# Patient Record
Sex: Male | Born: 1965 | Race: Black or African American | Hispanic: No | Marital: Single | Smoking: Never smoker
Health system: Southern US, Community
[De-identification: ages and names within clinical notes are randomized; demographics above are authoritative.]

## PROBLEM LIST (undated history)

## (undated) DIAGNOSIS — I10 Essential (primary) hypertension: Secondary | ICD-10-CM

---

## 2007-09-29 ENCOUNTER — Ambulatory Visit: Payer: Self-pay | Admitting: Internal Medicine

## 2007-09-29 ENCOUNTER — Encounter (INDEPENDENT_AMBULATORY_CARE_PROVIDER_SITE_OTHER): Payer: Self-pay | Admitting: Family Medicine

## 2007-09-29 LAB — CONVERTED CEMR LAB
AST: 31 units/L (ref 0–37)
Albumin: 4.3 g/dL (ref 3.5–5.2)
Alkaline Phosphatase: 65 units/L (ref 39–117)
BUN: 12 mg/dL (ref 6–23)
Calcium: 9.1 mg/dL (ref 8.4–10.5)
Chloride: 103 meq/L (ref 96–112)
Eosinophils Absolute: 0.3 10*3/uL (ref 0.0–0.7)
Glucose, Bld: 98 mg/dL (ref 70–99)
LDL Cholesterol: 113 mg/dL — ABNORMAL HIGH (ref 0–99)
Lymphs Abs: 2.2 10*3/uL (ref 0.7–4.0)
MCV: 89 fL (ref 78.0–100.0)
Monocytes Relative: 9 % (ref 3–12)
Neutro Abs: 4 10*3/uL (ref 1.7–7.7)
Neutrophils Relative %: 56 % (ref 43–77)
Platelets: 259 10*3/uL (ref 150–400)
Potassium: 4.2 meq/L (ref 3.5–5.3)
RBC: 5.08 M/uL (ref 4.22–5.81)
Sodium: 139 meq/L (ref 135–145)
TSH: 1.58 microintl units/mL (ref 0.350–5.50)
Total Protein: 7.5 g/dL (ref 6.0–8.3)
WBC: 7.3 10*3/uL (ref 4.0–10.5)

## 2008-01-23 ENCOUNTER — Ambulatory Visit: Payer: Self-pay | Admitting: Family Medicine

## 2011-02-04 ENCOUNTER — Emergency Department (HOSPITAL_COMMUNITY)
Admission: EM | Admit: 2011-02-04 | Discharge: 2011-02-04 | Disposition: A | Discharge status: Home / Self Care | Payer: Self-pay | Attending: Emergency Medicine | Admitting: Emergency Medicine

## 2011-02-04 DIAGNOSIS — T2610XA Burn of cornea and conjunctival sac, unspecified eye, initial encounter: Secondary | ICD-10-CM | POA: Insufficient documentation

## 2011-02-04 DIAGNOSIS — H109 Unspecified conjunctivitis: Secondary | ICD-10-CM | POA: Insufficient documentation

## 2011-02-04 DIAGNOSIS — IMO0002 Reserved for concepts with insufficient information to code with codable children: Secondary | ICD-10-CM | POA: Insufficient documentation

## 2011-07-26 ENCOUNTER — Emergency Department (HOSPITAL_COMMUNITY): Payer: No Typology Code available for payment source

## 2011-07-26 ENCOUNTER — Emergency Department (HOSPITAL_COMMUNITY)
Admission: EM | Admit: 2011-07-26 | Discharge: 2011-07-26 | Disposition: A | Discharge status: Home / Self Care | Payer: No Typology Code available for payment source | Attending: Emergency Medicine | Admitting: Emergency Medicine

## 2011-07-26 ENCOUNTER — Encounter (HOSPITAL_COMMUNITY): Payer: Self-pay

## 2011-07-26 DIAGNOSIS — S161XXA Strain of muscle, fascia and tendon at neck level, initial encounter: Secondary | ICD-10-CM

## 2011-07-26 DIAGNOSIS — M542 Cervicalgia: Secondary | ICD-10-CM | POA: Insufficient documentation

## 2011-07-26 DIAGNOSIS — I10 Essential (primary) hypertension: Secondary | ICD-10-CM

## 2011-07-26 DIAGNOSIS — S139XXA Sprain of joints and ligaments of unspecified parts of neck, initial encounter: Secondary | ICD-10-CM | POA: Insufficient documentation

## 2011-07-26 DIAGNOSIS — Y92410 Unspecified street and highway as the place of occurrence of the external cause: Secondary | ICD-10-CM | POA: Insufficient documentation

## 2011-07-26 DIAGNOSIS — R0789 Other chest pain: Secondary | ICD-10-CM

## 2011-07-26 DIAGNOSIS — R071 Chest pain on breathing: Secondary | ICD-10-CM | POA: Insufficient documentation

## 2011-07-26 HISTORY — DX: Essential (primary) hypertension: I10

## 2011-07-26 MED ORDER — ACETAMINOPHEN-CODEINE #3 300-30 MG PO TABS: 1.0000 | ORAL_TABLET | Freq: Four times a day (QID) | ORAL | Status: AC | PRN

## 2011-07-26 MED ORDER — HYDROCHLOROTHIAZIDE 25 MG PO TABS: 25.0000 mg | ORAL_TABLET | Freq: Every day | ORAL | Status: DC

## 2011-07-26 MED ORDER — IBUPROFEN 800 MG PO TABS
800.0000 mg | ORAL_TABLET | Freq: Once | ORAL | Status: AC
  Administered 2011-07-26: 800 mg via ORAL
  Filled 2011-07-26: qty 1

## 2011-07-26 MED ORDER — ONDANSETRON 4 MG PO TBDP
4.0000 mg | ORAL_TABLET | Freq: Once | ORAL | Status: AC
  Administered 2011-07-26: 4 mg via ORAL
  Filled 2011-07-26: qty 1

## 2011-07-26 MED ORDER — HYDROMORPHONE HCL PF 1 MG/ML IJ SOLN
1.0000 mg | Freq: Once | INTRAMUSCULAR | Status: AC
  Administered 2011-07-26: 1 mg via INTRAMUSCULAR
  Filled 2011-07-26: qty 1

## 2011-07-26 MED ORDER — NAPROXEN 375 MG PO TABS: 375.0000 mg | ORAL_TABLET | Freq: Two times a day (BID) | ORAL | Status: AC
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
## 2011-07-26 NOTE — ED Notes (Signed)
Pt was cleared from the C- collar

## 2011-07-26 NOTE — ED Notes (Signed)
Pt ambulated to the nurses station with no assistance. Pt complained of pain to the right ribs.
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
## 2011-07-26 NOTE — ED Notes (Signed)
Pt was cleared from the back board by B. Theatre manager

## 2011-07-27 NOTE — ED Provider Notes (Signed)
Medical screening examination/treatment/procedure(s) were performed by non-physician practitioner and as supervising physician I was immediately available for consultation/collaboration.   Loren Racer, MD 07/27/11 [phone]

## 2016-06-03 ENCOUNTER — Observation Stay (HOSPITAL_COMMUNITY)
Admission: EM | Admit: 2016-06-03 | Discharge: 2016-06-05 | Disposition: A | Discharge status: Home / Self Care | Payer: 59 | Attending: Internal Medicine | Admitting: Internal Medicine

## 2016-06-03 ENCOUNTER — Encounter (HOSPITAL_COMMUNITY): Payer: Self-pay | Admitting: Emergency Medicine

## 2016-06-03 ENCOUNTER — Emergency Department (HOSPITAL_COMMUNITY): Payer: 59

## 2016-06-03 DIAGNOSIS — I214 Non-ST elevation (NSTEMI) myocardial infarction: Secondary | ICD-10-CM

## 2016-06-03 DIAGNOSIS — I10 Essential (primary) hypertension: Secondary | ICD-10-CM

## 2016-06-03 DIAGNOSIS — J45909 Unspecified asthma, uncomplicated: Secondary | ICD-10-CM | POA: Insufficient documentation

## 2016-06-03 DIAGNOSIS — I471 Supraventricular tachycardia, unspecified: Secondary | ICD-10-CM | POA: Diagnosis present

## 2016-06-03 DIAGNOSIS — I252 Old myocardial infarction: Secondary | ICD-10-CM | POA: Insufficient documentation

## 2016-06-03 DIAGNOSIS — Z7982 Long term (current) use of aspirin: Secondary | ICD-10-CM | POA: Diagnosis not present

## 2016-06-03 DIAGNOSIS — R7989 Other specified abnormal findings of blood chemistry: Secondary | ICD-10-CM

## 2016-06-03 DIAGNOSIS — R778 Other specified abnormalities of plasma proteins: Secondary | ICD-10-CM

## 2016-06-03 DIAGNOSIS — R748 Abnormal levels of other serum enzymes: Secondary | ICD-10-CM | POA: Diagnosis present

## 2016-06-03 DIAGNOSIS — R0602 Shortness of breath: Secondary | ICD-10-CM | POA: Diagnosis not present

## 2016-06-03 LAB — CBC
HCT: 44.7 % (ref 39.0–52.0)
HEMOGLOBIN: 15.3 g/dL (ref 13.0–17.0)
MCH: 28.8 pg (ref 26.0–34.0)
MCHC: 34.2 g/dL (ref 30.0–36.0)
MCV: 84.2 fL (ref 78.0–100.0)
Platelets: 228 10*3/uL (ref 150–400)
RBC: 5.31 MIL/uL (ref 4.22–5.81)
RDW: 13.7 % (ref 11.5–15.5)
WBC: 9 10*3/uL (ref 4.0–10.5)

## 2016-06-03 LAB — BASIC METABOLIC PANEL
ANION GAP: 9 (ref 5–15)
BUN: 15 mg/dL (ref 6–20)
CALCIUM: 8.9 mg/dL (ref 8.9–10.3)
CO2: 24 mmol/L (ref 22–32)
Chloride: 104 mmol/L (ref 101–111)
Creatinine, Ser: 1.21 mg/dL (ref 0.61–1.24)
Glucose, Bld: 121 mg/dL — ABNORMAL HIGH (ref 65–99)
Potassium: 3.8 mmol/L (ref 3.5–5.1)
Sodium: 137 mmol/L (ref 135–145)

## 2016-06-03 LAB — RAPID URINE DRUG SCREEN, HOSP PERFORMED
Amphetamines: NOT DETECTED
Barbiturates: NOT DETECTED
Benzodiazepines: NOT DETECTED
COCAINE: NOT DETECTED
OPIATES: NOT DETECTED
Tetrahydrocannabinol: NOT DETECTED

## 2016-06-03 LAB — I-STAT TROPONIN, ED
TROPONIN I, POC: 0.02 ng/mL (ref 0.00–0.08)
Troponin i, poc: 0.84 ng/mL (ref 0.00–0.08)

## 2016-06-03 LAB — TSH: TSH: 1.671 u[IU]/mL (ref 0.350–4.500)

## 2016-06-03 MED ORDER — ADENOSINE 6 MG/2ML IV SOLN: 6.0000 mg | Freq: Once | INTRAVENOUS | Status: DC

## 2016-06-03 MED ORDER — ASPIRIN 325 MG PO TABS
325.0000 mg | ORAL_TABLET | ORAL | Status: AC
  Administered 2016-06-03: 325 mg via ORAL
  Filled 2016-06-03: qty 1

## 2016-06-03 NOTE — ED Notes (Signed)
Pt converted to NSR after modified vagle attempt.  EKG done.

## 2016-06-03 NOTE — H&P (Addendum)
Patient ID: Dakota Holmes MRN: [id number], DOB/AGE: 53/12/1965  Admit date: 06/03/2016 Primary Physician: No PCP Per Patient Primary Cardiologist: None  CARDIOLOGY ADMISSION History & Physical   PATIENT PROFILE   51 year old man with hypertension, otherwise healthy at baseline   [name]   The patient presented to the ED today with a few hours of palpitations and SOB. He was watching TV when he suddenly felt his heart start to race. He denies lightheadedness or syncope. No chest pain. He tried to "walk it off" but felt very short of breath when attempting to walk. He denies chest pain or angina. He came to ED where ECG showed SVT with rate ~200 bpm. He converted to NSR, but did have inferolateral ST depressions that persisted. First troponin was negative, but second value was 0.8 (normal < 0.08). He has been asymptomatic since cardioversion.  He denies fevers, chills, recent travel, sick contacts. He did have one 40 oz beer yesterday, but none today. No illicit drug use. No smoking. No new medications. Family states he has mild snoring. He feels well rested when he wakes up in the morning.  Otherwise labs are unremarkable. TSH within normal limits. Tox screen negative.  Review of Systems General:  No chills, fever, night sweats or weight changes.  Cardiovascular:  No chest pain,  edema, orthopnea, palpitations, paroxysmal nocturnal dyspnea. Dermatological: No rash, lesions/masses Respiratory: No cough Urologic: No hematuria, dysuria Abdominal:   No nausea, vomiting, diarrhea, bright red blood per rectum, melena, or hematemesis Neurologic:  No visual changes, wkns, changes in mental status. All other systems reviewed and are otherwise negative except as noted above.   MEDICAL, FAMILY, AND SOCIAL HISTORY   Past Medical History:  Diagnosis Date  . Hypertension     History reviewed. No pertinent surgical history.  No Known Allergies Prior to Admission  medications   Medication Sig Start Date End Date Taking? Authorizing Provider  acetaminophen (TYLENOL) 325 MG tablet Take 650 mg by mouth every 6 (six) hours as needed for mild pain.   Yes Historical Provider, MD  Aspirin-Acetaminophen-Caffeine (GOODY HEADACHE PO) Take 1 packet by mouth as needed. For headaches.   Yes Historical Provider, MD  hydrochlorothiazide (HYDRODIURIL) 25 MG tablet Take 1 tablet (25 mg total) by mouth daily. 07/26/11 07/25/12  Anne Ng, PA-C   No family history on file. Social History   Social History  . Marital status: Single    Spouse name: N/A  . Number of children: N/A  . Years of education: N/A   Occupational History  . Not on file.   Social History Main Topics  . Smoking status: Never Smoker  . Smokeless tobacco: Never Used  . Alcohol use Yes  . Drug use: No  . Sexual activity: Not on file   Other Topics Concern  . Not on file   Social History Narrative  . No narrative on file     He works at a tire shop No regimented exercise No tobacco Drinks a couple beers per night No illicit substances   PHYSICAL EXAM  Blood pressure (!) 142/104, pulse 81, temperature 98.5 F (36.9 C), temperature source Oral, resp. rate 18, height 5\' 9"  (1.753 m), weight 95.3 kg (210 lb), SpO2 98 %.  General: Pleasant, NAD Psych: Normal affect. Neuro: Alert and oriented X 3. Moves all extremities spontaneously. HEENT: Sclera anicteric, noninjected. MMM.  Neck: no bruits. JVP is not elevated. Lungs:  Clear, normal WOB Heart: rrr no m/r/g  Abdomen: Soft, non-tender, non-distended, BS + x 4.  Extremities: No clubbing or cyanosis. Edema: none. DP/PT/Radials 2+ and equal bilaterally.   LABS and STUDIES  Troponin (Point of Care Test)  Recent Labs  06/03/16 2051  TROPIPOC 0.84*   No results for input(s): CKTOTAL, CKMB, TROPONINI in the last 72 hours. Lab Results  Component Value Date   WBC 9.0 06/03/2016   HGB 15.3 06/03/2016   HCT 44.7 06/03/2016   MCV 84.2  06/03/2016   PLT 228 06/03/2016    Recent Labs Lab 06/03/16 1606  NA 137  K 3.8  CL 104  CO2 24  BUN 15  CREATININE 1.21  CALCIUM 8.9  GLUCOSE 121*   Lab Results  Component Value Date   CHOL 207 (H) 09/29/2007   HDL 49 09/29/2007   LDLCALC 113 (H) 09/29/2007   TRIG 224 (H) 09/29/2007   No results found for: DDIMER   Radiology/Studies. Independently Reviewed CXR clear  ECG was independently reviewed. Admission ecg showed narrow complex taachycardia with HR 197 bpm. 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 He should also consider sleep study as an outpatient. Patient denies angina. - Metoprolol 25 mg BID - Transthoracic echocardiogram in AM  Elevated Troponin 0.8 - Likely demand ischemia in the setting of tachycardia. However, ECG does show signs concerning for ischemia. Risk factors include age, sex, and hypertension - Aspirin 325 mg x 1, then 81 mg daily - heparin ACS nomogram overnight - Metoprolol as above - Trend troponin - Consider ischemic evaluation inpatient/outpatient pending troponin trend - Lipid panel and A1C pending  Hypertension: - HCTZ 25 mg daily - Starting metoprolol as above  IVF: None Diet: heart healthy, NPO at midnight DVT PPX: heparin as above Dispo: Admit to observation Code Status: full code  Dakota Holmes D, MD 06/03/2016, 10:24 PM

## 2016-06-03 NOTE — ED Provider Notes (Signed)
[facility] DEPT Provider Note   CSN: [id number] Arrival date & time: 06/03/16  1546     History   Chief Complaint Chief Complaint  Patient presents with  . Headache  . Tachycardia  . Shortness of Breath    HPI Dakota Holmes is a 51 y.o. male.  HPI Pt c/o while sitting on couch watching basketball game, pt began to experience heart racing, shortness of breath and headache.  Patient states the symptoms started around 1:30. He has never had symptoms like this before. He does have a history of hypertension the past but has stopped taking his medication due to loss of primary care provider. He did describe some mild shortness of breath.   No recent immobilization, no history of clots, no leg swelling or pain, no recent surgery. Has never had a clot in his lungs or his heart.  Denies any infectious symptoms. Has not used any cocaine.  Symptoms are severe, he fell he was going to pass out. Not worse with exertion. Onset during rest.  Past Medical History:  Diagnosis Date  . Hypertension     Patient Active Problem List   Diagnosis Date Noted  . SVT (supraventricular tachycardia) ([place]) 06/03/2016  . NSTEMI, initial episode of care [facility])   . Hypertension     History reviewed. No pertinent surgical history.     Home Medications    Prior to Admission medications   Medication Sig Start Date End Date Taking? Authorizing Provider  acetaminophen (TYLENOL) 325 MG tablet Take 650 mg by mouth every 6 (six) hours as needed for mild pain.   Yes Historical Provider, MD  Aspirin-Acetaminophen-Caffeine (GOODY HEADACHE PO) Take 1 packet by mouth as needed. For headaches.   Yes Historical Provider, MD  hydrochlorothiazide (HYDRODIURIL) 25 MG tablet Take 1 tablet (25 mg total) by mouth daily. 07/26/11 07/25/12  Anne Ng, PA-C    Family History No family history on file.  Social History Social History  Substance Use Topics  . Smoking status: Never Smoker  . Smokeless tobacco:  Never Used  . Alcohol use Yes     Allergies   Patient has no known allergies.   Review of Systems Review of Systems  Constitutional: Negative for fever.  Allergic/Immunologic: Negative for immunocompromised state.  All other systems reviewed and are negative.    Physical Exam Updated Vital Signs BP 121/77 (BP Location: Left Arm)   Pulse (!) 202   Temp 98.5 F (36.9 C) (Oral)   Resp 18   Ht 5\' 9"  (1.753 m)   Wt 95.3 kg   SpO2 100%   BMI 31.01 kg/m   Physical Exam  Constitutional: He appears well-developed and well-nourished. No distress.  HENT:  Head: Normocephalic and atraumatic.  Left Ear: External ear normal.  Eyes: Conjunctivae are normal. Pupils are equal, round, and reactive to light. Right eye exhibits no discharge. Left eye exhibits no discharge.  Neck: Normal range of motion. Neck supple.  Cardiovascular: Regular rhythm.   No murmur heard. Tachy to 200s  Pulmonary/Chest: Effort normal and breath sounds normal. No respiratory distress. He has no wheezes. He has no rales. He exhibits no tenderness.  Abdominal: Soft. Bowel sounds are normal. He exhibits no distension and no mass. There is no tenderness. There is no rebound and no guarding.  Musculoskeletal: He exhibits no edema or tenderness (of legs, no asymmetry, no edema).  Neurological: He is alert.  Skin: Skin is warm. Capillary refill takes less than 2 seconds. He is not diaphoretic.  Psychiatric: He has a normal mood and affect.     ED Treatments / Results  Labs (all labs ordered are listed, but only abnormal results are displayed) Labs Reviewed  BASIC METABOLIC PANEL - Abnormal; Notable for the following:       Result Value   Glucose, Bld 121 (*)    All other components within normal limits  I-STAT TROPOININ, ED - Abnormal; Notable for the following:    Troponin i, poc 0.84 (*)    All other components within normal limits  CBC  TSH  RAPID URINE DRUG SCREEN, HOSP PERFORMED  LIPID PANEL    MAGNESIUM  HEMOGLOBIN A1C  HEPARIN LEVEL (UNFRACTIONATED)  CBC  I-STAT TROPOININ, ED    EKG  EKG Interpretation  Date/Time:  Sunday June 03 2016 18:05:38 EST Ventricular Rate:  93 PR Interval:    QRS Duration: 111 QT Interval:  357 QTC Calculation: 444 R Axis:   -52 Text Interpretation:  Sinus rhythm Inferior infarct, old Lateral infarct, acute Confirmed by [facility]  MD, MELANIE ([id number]) on 06/03/2016 6:55:35 PM       Radiology Dg Chest 2 View  Result Date: 06/03/2016 CLINICAL DATA:  Shortness of breath, weakness, headache, tachycardia, history of asthma EXAM: CHEST  2 VIEW COMPARISON:  07/26/2011 FINDINGS: Lungs are clear.  No pleural effusion or pneumothorax. The heart is normal in size. Mild degenerative changes of the visualized thoracolumbar spine. IMPRESSION: Normal chest radiographs. Electronically Signed   By: Julian Hy M.D.   On: 06/03/2016 16:46    Procedures Procedures (including critical care time)  Medications Ordered in ED Medications  heparin bolus via infusion 4,000 Units (not administered)  heparin ADULT infusion 100 units/mL (25000 units/28mL sodium chloride 0.45%) (not administered)  aspirin tablet 325 mg (325 mg Oral Given 06/03/16 2340)     Initial Impression / Assessment and Plan / ED Course  I have reviewed the triage vital signs and the nursing notes.  Pertinent labs & imaging results that were available during my care of the patient were reviewed by me and considered in my medical decision making (see chart for details).    Patient arrives in SVT, hemodynamically stable. Does have some mild shortness of breath but not appear.  Pads placed, modified Valsalva performed and patient back to sinus rhythm. No obvious PE or CAD history. No suspicion for PE. However, indicates she widely abnormal with ST depressions. Repeat EKG obtained and shows concern for acute MI. Patient maintains that no chest pain or shortness of breath at this time.  Initial troponin is negative at very recent onset. Labs, TSH and chest x-ray unremarkable.  Discuss with cardiology. Given repeat troponin which is increasing, and they will admit for observation and elected to start on heparin. Pt reassessed and updated, he continues to say no chest pain or shortness of breath.  Final Clinical Impressions(s) / ED Diagnoses   Final diagnoses:  Elevated troponin    New Prescriptions New Prescriptions   No medications on file     Karma Greaser, MD 06/04/16 0121    Malvin Johns, MD 06/04/16 1410

## 2016-06-03 NOTE — ED Triage Notes (Signed)
Pt c/o while sitting on couch watching basketball game, pt began to experience heart racing, shortness of breath and headache.

## 2016-06-03 NOTE — ED Notes (Signed)
Notified edp Istat troponin results

## 2016-06-03 NOTE — ED Notes (Signed)
Advised Dr. Mliss Sax that pt not able to go to telemetry floor that was assigned per that charge nurse d/t troponin and EKG results.  Dr. Mliss Sax to put in for stepdown.

## 2016-06-04 ENCOUNTER — Observation Stay (HOSPITAL_COMMUNITY): Payer: 59

## 2016-06-04 ENCOUNTER — Observation Stay (HOSPITAL_BASED_OUTPATIENT_CLINIC_OR_DEPARTMENT_OTHER): Payer: 59

## 2016-06-04 ENCOUNTER — Telehealth: Payer: Self-pay | Admitting: Cardiovascular Disease

## 2016-06-04 DIAGNOSIS — Z7982 Long term (current) use of aspirin: Secondary | ICD-10-CM | POA: Diagnosis not present

## 2016-06-04 DIAGNOSIS — R079 Chest pain, unspecified: Secondary | ICD-10-CM

## 2016-06-04 DIAGNOSIS — J45909 Unspecified asthma, uncomplicated: Secondary | ICD-10-CM | POA: Diagnosis not present

## 2016-06-04 DIAGNOSIS — I10 Essential (primary) hypertension: Secondary | ICD-10-CM | POA: Diagnosis not present

## 2016-06-04 DIAGNOSIS — R748 Abnormal levels of other serum enzymes: Secondary | ICD-10-CM | POA: Diagnosis not present

## 2016-06-04 DIAGNOSIS — I471 Supraventricular tachycardia: Secondary | ICD-10-CM | POA: Diagnosis not present

## 2016-06-04 LAB — BASIC METABOLIC PANEL
Anion gap: 8 (ref 5–15)
BUN: 10 mg/dL (ref 6–20)
CHLORIDE: 102 mmol/L (ref 101–111)
CO2: 27 mmol/L (ref 22–32)
CREATININE: 1.08 mg/dL (ref 0.61–1.24)
Calcium: 8.7 mg/dL — ABNORMAL LOW (ref 8.9–10.3)
GFR calc Af Amer: >60 mL/min (ref >60)
Glucose, Bld: 117 mg/dL — ABNORMAL HIGH (ref 65–99)
Potassium: 4 mmol/L (ref 3.5–5.1)
SODIUM: 137 mmol/L (ref 135–145)

## 2016-06-04 LAB — CBC
HEMATOCRIT: 45.3 % (ref 39.0–52.0)
Hemoglobin: 15.4 g/dL (ref 13.0–17.0)
MCH: 28.8 pg (ref 26.0–34.0)
MCHC: 34 g/dL (ref 30.0–36.0)
MCV: 84.8 fL (ref 78.0–100.0)
PLATELETS: 224 10*3/uL (ref 150–400)
RBC: 5.34 MIL/uL (ref 4.22–5.81)
RDW: 13.9 % (ref 11.5–15.5)
WBC: 7 10*3/uL (ref 4.0–10.5)

## 2016-06-04 LAB — LIPID PANEL
Cholesterol: 176 mg/dL (ref 0–200)
HDL: 53 mg/dL (ref >40)
LDL CALC: 96 mg/dL (ref 0–99)
TRIGLYCERIDES: 134 mg/dL (ref <150)
Total CHOL/HDL Ratio: 3.3 RATIO
VLDL: 27 mg/dL (ref 0–40)

## 2016-06-04 LAB — MAGNESIUM
MAGNESIUM: 2.3 mg/dL (ref 1.7–2.4)
Magnesium: 2.1 mg/dL (ref 1.7–2.4)

## 2016-06-04 LAB — TROPONIN I
Troponin I: 0.82 ng/mL (ref <0.03)
Troponin I: 0.57 ng/mL (ref <0.03)
Troponin I: 0.41 ng/mL (ref <0.03)

## 2016-06-04 LAB — MRSA PCR SCREENING: MRSA by PCR: NEGATIVE

## 2016-06-04 LAB — ECHOCARDIOGRAM COMPLETE
HEIGHTINCHES: 69 in
Weight: 3350.4 oz

## 2016-06-04 LAB — TSH: TSH: 3.451 u[IU]/mL (ref 0.350–4.500)

## 2016-06-04 LAB — HEPARIN LEVEL (UNFRACTIONATED): Heparin Unfractionated: 0.28 IU/mL — ABNORMAL LOW (ref 0.30–0.70)

## 2016-06-04 MED ORDER — HEPARIN (PORCINE) IN NACL 100-0.45 UNIT/ML-% IJ SOLN
1450.0000 [IU]/h | INTRAMUSCULAR | Status: DC
  Administered 2016-06-04: 1300 [IU]/h via INTRAVENOUS
  Filled 2016-06-04: qty 250

## 2016-06-04 MED ORDER — ASPIRIN EC 81 MG PO TBEC
81.0000 mg | DELAYED_RELEASE_TABLET | Freq: Every day | ORAL | Status: DC
  Administered 2016-06-04 – 2016-06-05 (×2): 81 mg via ORAL
  Filled 2016-06-04 (×2): qty 1

## 2016-06-04 MED ORDER — ACETAMINOPHEN 650 MG RE SUPP: 650.0000 mg | Freq: Four times a day (QID) | RECTAL | Status: DC | PRN

## 2016-06-04 MED ORDER — HYDROCHLOROTHIAZIDE 25 MG PO TABS
25.0000 mg | ORAL_TABLET | Freq: Every day | ORAL | Status: DC
  Administered 2016-06-04 – 2016-06-05 (×2): 25 mg via ORAL
  Filled 2016-06-04 (×2): qty 1

## 2016-06-04 MED ORDER — HYDRALAZINE HCL 20 MG/ML IJ SOLN
10.0000 mg | Freq: Four times a day (QID) | INTRAMUSCULAR | Status: DC | PRN
  Administered 2016-06-04: 10 mg via INTRAVENOUS
  Filled 2016-06-04: qty 1

## 2016-06-04 MED ORDER — SODIUM CHLORIDE 0.9% FLUSH
3.0000 mL | Freq: Two times a day (BID) | INTRAVENOUS | Status: DC
Start: 2016-06-04 — End: 2016-06-05
  Administered 2016-06-04: 3 mL via INTRAVENOUS

## 2016-06-04 MED ORDER — HEPARIN BOLUS VIA INFUSION
4000.0000 [IU] | Freq: Once | INTRAVENOUS | Status: AC
  Administered 2016-06-04: 4000 [IU] via INTRAVENOUS
  Filled 2016-06-04: qty 4000

## 2016-06-04 MED ORDER — ALBUTEROL SULFATE (2.5 MG/3ML) 0.083% IN NEBU: 2.5000 mg | INHALATION_SOLUTION | Freq: Four times a day (QID) | RESPIRATORY_TRACT | Status: DC

## 2016-06-04 MED ORDER — LOSARTAN POTASSIUM 50 MG PO TABS
100.0000 mg | ORAL_TABLET | Freq: Every day | ORAL | Status: DC
  Administered 2016-06-04 – 2016-06-05 (×2): 100 mg via ORAL
  Filled 2016-06-04 (×2): qty 2

## 2016-06-04 MED ORDER — INFLUENZA VAC SPLIT QUAD 0.5 ML IM SUSY
0.5000 mL | PREFILLED_SYRINGE | INTRAMUSCULAR | Status: AC
  Administered 2016-06-05: 0.5 mL via INTRAMUSCULAR
  Filled 2016-06-04: qty 0.5

## 2016-06-04 MED ORDER — ACETAMINOPHEN 325 MG PO TABS: 650.0000 mg | ORAL_TABLET | Freq: Four times a day (QID) | ORAL | Status: DC | PRN

## 2016-06-04 MED ORDER — GUAIFENESIN ER 600 MG PO TB12
600.0000 mg | ORAL_TABLET | Freq: Two times a day (BID) | ORAL | Status: DC
Start: 2016-06-04 — End: 2016-06-05
  Administered 2016-06-04 – 2016-06-05 (×3): 600 mg via ORAL
  Filled 2016-06-04 (×4): qty 1

## 2016-06-04 MED ORDER — METOPROLOL TARTRATE 25 MG PO TABS
25.0000 mg | ORAL_TABLET | Freq: Two times a day (BID) | ORAL | Status: DC
Start: 2016-06-04 — End: 2016-06-05
  Administered 2016-06-04 – 2016-06-05 (×4): 25 mg via ORAL
  Filled 2016-06-04 (×4): qty 1

## 2016-06-04 MED ORDER — POLYETHYLENE GLYCOL 3350 17 G PO PACK: 17.0000 g | PACK | Freq: Every day | ORAL | Status: DC | PRN

## 2016-06-04 NOTE — Progress Notes (Signed)
ANTICOAGULATION CONSULT NOTE - Initial Consult  Pharmacy Consult for heparin Indication: chest pain/ACS  No Known Allergies  Patient Measurements: Height: 5\' 9"  (175.3 cm) Weight: 210 lb (95.3 kg) IBW/kg (Calculated) : 70.7 Heparin Dosing Weight: 90kg  Vital Signs: Temp: 98.5 F (36.9 C) (02/11 1555) Temp Source: Oral (02/11 1555) BP: 165/101 (02/11 2330) Pulse Rate: 69 (02/11 2330)  Labs:  Recent Labs  06/03/16 1606  HGB 15.3  HCT 44.7  PLT 228  CREATININE 1.21    Estimated Creatinine Clearance: 81.3 mL/min (by C-G formula based on SCr of 1.21 mg/dL).   Medical History: Past Medical History:  Diagnosis Date  . Hypertension     Assessment: 51yo male c/o palpitations and SOB, found to be in SVT (now in NSR), also w/ elevated troponin, likely demand ischemia though ECG concerning for ischemia per H&P, to begin heparin.  Goal of Therapy:  Heparin level 0.3-0.7 units/ml Monitor platelets by anticoagulation protocol: Yes   Plan:  Will give heparin 4000 units bolus followed by gtt at 1300 units/hr and monitor heparin levels and CBC.  Wynona Neat, PharmD, BCPS  06/04/2016,12:49 AM

## 2016-06-04 NOTE — Progress Notes (Signed)
  Echocardiogram 2D Echocardiogram has been performed.  Dakota Holmes 06/04/2016, 12:24 PM

## 2016-06-04 NOTE — ED Notes (Signed)
Main lab adding on magnesium

## 2016-06-04 NOTE — Progress Notes (Signed)
Progress Note  Patient Name: Dakota Holmes Date of Encounter: 06/04/2016  Primary Cardiologist: New Dr. Johnsie Cancel  Subjective   Pt feeling well. No chest pain, dyspnea, palpitations, orthopnea, dizziness or head ache.   Inpatient Medications    Scheduled Meds: . albuterol  2.5 mg Nebulization Q6H  . aspirin EC  81 mg Oral Daily  . guaiFENesin  600 mg Oral BID  . hydrochlorothiazide  25 mg Oral Daily  . [START ON 06/05/2016] Influenza vac split quadrivalent PF  0.5 mL Intramuscular Tomorrow-1000  . metoprolol tartrate  25 mg Oral BID  . sodium chloride flush  3 mL Intravenous Q12H   Continuous Infusions:  PRN Meds: acetaminophen **OR** acetaminophen, polyethylene glycol   Vital Signs    Vitals:   06/04/16 0500 06/04/16 0600 06/04/16 0700 06/04/16 0808  BP: (!) 163/107 (!) 148/99 (!) 166/104   Pulse:      Resp: 12 16 18    Temp:    97.9 F (36.6 C)  TempSrc:    Oral  SpO2:      Weight:      Height:        Intake/Output Summary (Last 24 hours) at 06/04/16 [id number] Last data filed at 06/04/16 0700  Gross per 24 hour  Intake            69.55 ml  Output             1650 ml  Net         -1580.45 ml   Filed Weights   06/03/16 1555 06/04/16 0340  Weight: 210 lb (95.3 kg) 209 lb 6.4 oz (95 kg)    Telemetry    Sinus rhythm in the 70's, Had 14 beats SVT at 23:10, 10 beats NSVT at 20:12.- Personally Reviewed  ECG    06/03/2016 1600: SVT 197 bpm 06/03/2016 1805 Sinus rhythm 93 bpm, inferolateral ST depression, TWI lead II and III 06/04/2016  0652 Sinus rhythm 72 bpm, Inferior non-specific T wave abnormality  - Personally Reviewed  Physical Exam   GEN: No acute distress.   Neck: No JVD Cardiac: RRR, no murmurs, rubs, or gallops.  Respiratory: Clear to auscultation bilaterally. GI: Soft, nontender, non-distended  MS: No edema; No deformity. Neuro:  Nonfocal  Psych: Normal affect   Labs    Chemistry Recent Labs Lab 06/03/16 1606 06/04/16 0736  NA 137 137  K  3.8 4.0  CL 104 102  CO2 24 27  GLUCOSE 121* 117*  BUN 15 10  CREATININE 1.21 1.08  CALCIUM 8.9 8.7*  GFRNONAA >60 >60  GFRAA >60 >60  ANIONGAP 9 8     Hematology Recent Labs Lab 06/03/16 1606 06/04/16 0736  WBC 9.0 7.0  RBC 5.31 5.34  HGB 15.3 15.4  HCT 44.7 45.3  MCV 84.2 84.8  MCH 28.8 28.8  MCHC 34.2 34.0  RDW 13.7 13.9  PLT 228 224    Cardiac Enzymes Recent Labs Lab 06/04/16 0211 06/04/16 0736  TROPONINI 0.82* 0.57*    Recent Labs Lab 06/03/16 1623 06/03/16 2051  TROPIPOC 0.02 0.84*     BNPNo results for input(s): BNP, PROBNP in the last 168 hours.   DDimer No results for input(s): DDIMER in the last 168 hours.   Radiology    Dg Chest 2 View  Result Date: 06/03/2016 CLINICAL DATA:  Shortness of breath, weakness, headache, tachycardia, history of asthma EXAM: CHEST  2 VIEW COMPARISON:  07/26/2011 FINDINGS: Lungs are clear.  No pleural effusion or pneumothorax. The  heart is normal in size. Mild degenerative changes of the visualized thoracolumbar spine. IMPRESSION: Normal chest radiographs. Electronically Signed   By: Julian Hy M.D.   On: 06/03/2016 16:46    Cardiac Studies   Echo pending  Patient Profile     51 y.o. male with past medical history of hypertension (untreated since 12/2015) who presented to the [facility] ED for evaluation of shortness of breath and lightheadedness. He was found to be in SVT around 200 bpm.   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 Duration likely several hours. Converted to SR in ED with vagal maneuvers -No chest pain/pressure. Dyspnea and lightheadedness resolved with conversion. No previous chest pain, DOE, orthopnea or edema.  -labs are unremarkable. TSH within normal limits. Tox screen negative. -Troponins mildly elevated. -Metoprolol 25 mg bid initiated. Had episode of SVT 14 beats last night prior to first dose of metoprolol. -Plan to monitor over night and  follow up outpatient with Nuclear stress test.   Elevated troponin - POC troponin 0.02, 0.84, lab troponin I   0.82 -->0.57 -mild elevation likely demand ischemia in setting of tachycardia. However, ECG does show signs concerning for ischemia. Risk factors include age, sex, and hypertension. EKG with improvement over time with this morning only Non-specific T wave changes inferiorly - Aspirin and heparin per ACS was inititated -Plan out patient lexiscan myoview.  -Echo ordered to assess wall motion and LV function  Hypertension -Elevated blood pressures 140's-170/99-113 -Home HCTZ initiated, metoprolol added. (Had not been taking any meds since 12/2015- needs new PCP) -Will add hydralazine prn. -Add ARB   Signed, Daune Perch, NP  06/04/2016, 9:22 AM    Patient examined chart reviewed. Issues with compliance with home BP meds. Maint NSR exam with S4 gallop Elevated BP Start ARB If echo ok and BP better d/c home with outpatient f/u and consider myovue when BP under Better control  [name]

## 2016-06-04 NOTE — Telephone Encounter (Signed)
New message     TOC appt made on 2/26 at 10am per Pecolia Ades.

## 2016-06-04 NOTE — Progress Notes (Signed)
[place] for heparin Indication: chest pain/ACS  No Known Allergies  Patient Measurements: Height: 5\' 9"  (175.3 cm) Weight: 209 lb 6.4 oz (95 kg) IBW/kg (Calculated) : 70.7 Heparin Dosing Weight: 90kg  Vital Signs: Temp: 97.9 F (36.6 C) (02/12 0808) Temp Source: Oral (02/12 0808) BP: 166/104 (02/12 0700) Pulse Rate: 71 (02/12 0430)  Labs:  Recent Labs  06/03/16 1606 06/04/16 0211 06/04/16 0736  HGB 15.3  --  15.4  HCT 44.7  --  45.3  PLT 228  --  224  HEPARINUNFRC  --   --  0.28*  CREATININE 1.21  --   --   TROPONINI  --  0.82*  --     Estimated Creatinine Clearance: 81.2 mL/min (by C-G formula based on SCr of 1.21 mg/dL).   Medical History: Past Medical History:  Diagnosis Date  . Hypertension     Assessment: 51yo male c/o palpitations and SOB, found to be in SVT also w/ elevated troponin on heparin. -initial heparin level= 0.28 and below goal  Goal of Therapy:  Heparin level 0.3-0.7 units/ml Monitor platelets by anticoagulation protocol: Yes   Plan:  -Increase heparin to 1450 units/he -Heparin level in 6 hours and daily wth CBC daily  Hildred Laser, Pharm D 06/04/2016 8:36 AM

## 2016-06-04 NOTE — Progress Notes (Signed)
RN called E.Pauley MD regarding pt's SBP of 170-160's, HR 60's. No new orders received at this time.

## 2016-06-05 DIAGNOSIS — I471 Supraventricular tachycardia: Secondary | ICD-10-CM | POA: Diagnosis not present

## 2016-06-05 DIAGNOSIS — I10 Essential (primary) hypertension: Secondary | ICD-10-CM | POA: Diagnosis not present

## 2016-06-05 DIAGNOSIS — R748 Abnormal levels of other serum enzymes: Secondary | ICD-10-CM | POA: Diagnosis not present

## 2016-06-05 LAB — CBC
HCT: 48.9 % (ref 39.0–52.0)
HEMOGLOBIN: 16.8 g/dL (ref 13.0–17.0)
MCH: 28.8 pg (ref 26.0–34.0)
MCHC: 34.4 g/dL (ref 30.0–36.0)
MCV: 83.7 fL (ref 78.0–100.0)
PLATELETS: 233 10*3/uL (ref 150–400)
RBC: 5.84 MIL/uL — ABNORMAL HIGH (ref 4.22–5.81)
RDW: 13.8 % (ref 11.5–15.5)
WBC: 8.5 10*3/uL (ref 4.0–10.5)

## 2016-06-05 LAB — HEMOGLOBIN A1C
HEMOGLOBIN A1C: 5.8 % — AB (ref 4.8–5.6)
HEMOGLOBIN A1C: 5.8 % — AB (ref 4.8–5.6)
Mean Plasma Glucose: 120 mg/dL
Mean Plasma Glucose: 120 mg/dL

## 2016-06-05 MED ORDER — LOSARTAN POTASSIUM 100 MG PO TABS: 100.0000 mg | ORAL_TABLET | Freq: Every day | ORAL | 5 refills | Status: AC

## 2016-06-05 MED ORDER — HYDROCHLOROTHIAZIDE 25 MG PO TABS: 25.0000 mg | ORAL_TABLET | Freq: Every day | ORAL | 5 refills | Status: AC

## 2016-06-05 MED ORDER — ASPIRIN 81 MG PO TBEC: 81.0000 mg | DELAYED_RELEASE_TABLET | Freq: Every day | ORAL | 11 refills | Status: AC

## 2016-06-05 MED ORDER — METOPROLOL TARTRATE 25 MG PO TABS: 25.0000 mg | ORAL_TABLET | Freq: Two times a day (BID) | ORAL | 5 refills | Status: AC

## 2016-06-05 NOTE — Progress Notes (Signed)
Patient discharged to home with all his belongings.  Sister here to pick up.  Escorted to the front entrance by staff via wheelchair.  No complaint voiced at the time of discharge. Marcille Blanco, RN

## 2016-06-05 NOTE — Progress Notes (Signed)
Progress Note  Patient Name: Dakota Holmes Date of Encounter: 06/05/2016  Primary Cardiologist: New Dr. Johnsie Cancel  Subjective   No complaints has no primary   Inpatient Medications    Scheduled Meds: . aspirin EC  81 mg Oral Daily  . guaiFENesin  600 mg Oral BID  . hydrochlorothiazide  25 mg Oral Daily  . Influenza vac split quadrivalent PF  0.5 mL Intramuscular Tomorrow-1000  . losartan  100 mg Oral Daily  . metoprolol tartrate  25 mg Oral BID  . sodium chloride flush  3 mL Intravenous Q12H   Continuous Infusions:  PRN Meds: acetaminophen **OR** acetaminophen, hydrALAZINE, polyethylene glycol   Vital Signs    Vitals:   06/04/16 2016 06/04/16 2143 06/05/16 0611 06/05/16 0754  BP: 122/83 127/86 131/87 131/89  Pulse:  69 96 84  Resp: (!) 22 20 20 18   Temp:  98 F (36.7 C) 98.7 F (37.1 C)   TempSrc:  Oral Oral   SpO2:  97% 100% 100%  Weight:  207 lb 11.2 oz (94.2 kg) 205 lb (93 kg)   Height:        Intake/Output Summary (Last 24 hours) at 06/05/16 0916 Last data filed at 06/05/16 0844  Gross per 24 hour  Intake              480 ml  Output              650 ml  Net             -170 ml   Filed Weights   06/04/16 0340 06/04/16 2143 06/05/16 0611  Weight: 209 lb 6.4 oz (95 kg) 207 lb 11.2 oz (94.2 kg) 205 lb (93 kg)    Telemetry    Sinus rhythm in the 70's, Had 14 beats SVT at 23:10, 10 beats NSVT at 20:12.- Personally Reviewed  ECG    06/03/2016 1600: SVT 197 bpm 06/03/2016 1805 Sinus rhythm 93 bpm, inferolateral ST depression, TWI lead II and III 06/04/2016  0652 Sinus rhythm 72 bpm, Inferior non-specific T wave abnormality  - Personally Reviewed  Physical Exam   GEN: No acute distress.   Neck: No JVD Cardiac: RRR, no murmurs, rubs, or gallops.  Respiratory: Clear to auscultation bilaterally. GI: Soft, nontender, non-distended  MS: No edema; No deformity. Neuro:  Nonfocal  Psych: Normal affect   Labs    Chemistry  Recent Labs Lab  06/03/16 1606 06/04/16 0736  NA 137 137  K 3.8 4.0  CL 104 102  CO2 24 27  GLUCOSE 121* 117*  BUN 15 10  CREATININE 1.21 1.08  CALCIUM 8.9 8.7*  GFRNONAA >60 >60  GFRAA >60 >60  ANIONGAP 9 8     Hematology  Recent Labs Lab 06/03/16 1606 06/04/16 0736 06/05/16 0542  WBC 9.0 7.0 8.5  RBC 5.31 5.34 5.84*  HGB 15.3 15.4 16.8  HCT 44.7 45.3 48.9  MCV 84.2 84.8 83.7  MCH 28.8 28.8 28.8  MCHC 34.2 34.0 34.4  RDW 13.7 13.9 13.8  PLT 228 224 233    Cardiac Enzymes  Recent Labs Lab 06/04/16 0211 06/04/16 0736 06/04/16 1417  TROPONINI 0.82* 0.57* 0.41*     Recent Labs Lab 06/03/16 1623 06/03/16 2051  TROPIPOC 0.02 0.84*     BNPNo results for input(s): BNP, PROBNP in the last 168 hours.   DDimer No results for input(s): DDIMER in the last 168 hours.   Radiology    Dg Chest 2 View  Result Date:  06/03/2016 CLINICAL DATA:  Shortness of breath, weakness, headache, tachycardia, history of asthma EXAM: CHEST  2 VIEW COMPARISON:  07/26/2011 FINDINGS: Lungs are clear.  No pleural effusion or pneumothorax. The heart is normal in size. Mild degenerative changes of the visualized thoracolumbar spine. IMPRESSION: Normal chest radiographs. Electronically Signed   By: Julian Hy M.D.   On: 06/03/2016 16:46    Cardiac Studies   Echo pending  Patient Profile     51 y.o. male with past medical history of hypertension (untreated since 12/2015) who presented to the [facility] ED for evaluation of shortness of breath and lightheadedness. He was found to be in SVT around 200 bpm.   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 Duration likely several hours. Converted to SR in ED with vagal maneuvers non recurrent on beta blocker   Elevated troponin - POC troponin 0.02, 0.84, lab troponin I   0.82 -->0.57 -mild elevation echo with no RWMA;s will arrange outpatient myovue   Hypertension -improved with medication discussed compliance    Signed, [name], MD  06/05/2016, 9:16 AM

## 2016-06-05 NOTE — Telephone Encounter (Signed)
Called patient, was unable to leave message, phone message that the patient is unavailable.

## 2016-06-06 NOTE — Telephone Encounter (Signed)
Wireless customer is not available.   There was no voicemail.  No message left.

## 2016-06-07 NOTE — Telephone Encounter (Signed)
Message comes on stating Wireless customer you are calling is not available.  No VM option.

## 2016-06-08 ENCOUNTER — Encounter: Payer: Self-pay | Admitting: Nurse Practitioner

## 2016-06-08 NOTE — Telephone Encounter (Signed)
Received message wireless customer is not available.

## 2016-06-14 DIAGNOSIS — I10 Essential (primary) hypertension: Secondary | ICD-10-CM | POA: Diagnosis not present

## 2016-06-18 ENCOUNTER — Encounter (INDEPENDENT_AMBULATORY_CARE_PROVIDER_SITE_OTHER): Payer: Self-pay

## 2016-06-18 ENCOUNTER — Ambulatory Visit (INDEPENDENT_AMBULATORY_CARE_PROVIDER_SITE_OTHER): Payer: 59 | Admitting: Nurse Practitioner

## 2016-06-18 ENCOUNTER — Encounter: Payer: Self-pay | Admitting: Nurse Practitioner

## 2016-06-18 VITALS — BP 120/86 | HR 64 | Ht 69.0 in | Wt 212.1 lb

## 2016-06-18 DIAGNOSIS — I10 Essential (primary) hypertension: Secondary | ICD-10-CM | POA: Diagnosis not present

## 2016-06-18 DIAGNOSIS — R748 Abnormal levels of other serum enzymes: Secondary | ICD-10-CM

## 2016-06-18 DIAGNOSIS — R778 Other specified abnormalities of plasma proteins: Secondary | ICD-10-CM

## 2016-06-18 DIAGNOSIS — R7989 Other specified abnormal findings of blood chemistry: Principal | ICD-10-CM

## 2016-06-18 LAB — BASIC METABOLIC PANEL
BUN/Creatinine Ratio: 10 (ref 9–20)
BUN: 12 mg/dL (ref 6–24)
CO2: 25 mmol/L (ref 18–29)
Calcium: 9.2 mg/dL (ref 8.7–10.2)
Chloride: 94 mmol/L — ABNORMAL LOW (ref 96–106)
Creatinine, Ser: 1.17 mg/dL (ref 0.76–1.27)
GFR calc Af Amer: 82 mL/min/{1.73_m2} (ref >59)
GFR calc non Af Amer: 71 mL/min/{1.73_m2} (ref >59)
Glucose: 103 mg/dL — ABNORMAL HIGH (ref 65–99)
Potassium: 4.2 mmol/L (ref 3.5–5.2)
Sodium: 136 mmol/L (ref 134–144)

## 2016-06-18 NOTE — Patient Instructions (Addendum)
We will be checking the following labs today -  BMET   Medication Instructions:    Continue with your current medicines.     Testing/Procedures To Be Arranged:  Stress Myoview  Follow-Up:   See [place] back tentatively in 6 months - will be sooner if your stress test is abnormal.    Other Special Instructions:   N/A    If you need a refill on your cardiac medications before your next appointment, please call your pharmacy.   Call the [place] office at [phone] if you have any questions, problems or concerns.

## 2016-06-18 NOTE — Progress Notes (Signed)
CARDIOLOGY OFFICE NOTE  Date:  06/18/2016    Dakota Holmes Date of Birth: 1966/03/01 Medical Record [id number]  PCP:  Thressa Sheller, MD  Cardiologist:  Johnsie Cancel   Chief Complaint  Patient presents with  . Irregular Heart Beat    Post hospital visit - seen for Dr. Johnsie Cancel    History of Present Illness: Dakota Holmes is a 51 y.o. male who presents today for a post hospital visit - this was to be a TOC visit however phone call not able to be made.   He has a history of HTN (untreated since 12/2015).   Presented earlier this month to the ER with shortness of breath and lightheadedness. 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 Converted to NSR with vagal maneuvers. Troponin +. Beta blocker initiated. EKG concerning for ischemia. Multiple risk factors - outpatient Myoview was to be arranged once BP controlled. Additional medicines added for his BP.  Comes in today. Here alone. Has done well since discharge. Back to his usual activities. No chest pain. Does not really use too much caffeine. Had only had one beer that day. He remains very active. Back to work. His energy level is good. No more palpitations. He has a good understanding of how to try vagal maneuvers if recurs. BP better.   Past Medical History:  Diagnosis Date  . Hypertension     No past surgical history on file.   Medications: Current Outpatient Prescriptions  Medication Sig Dispense Refill  . acetaminophen (TYLENOL) 325 MG tablet Take 650 mg by mouth every 6 (six) hours as needed for mild pain.    Marland Kitchen aspirin EC 81 MG EC tablet Take 1 tablet (81 mg total) by mouth daily. 30 tablet 11  . hydrochlorothiazide (HYDRODIURIL) 25 MG tablet Take 1 tablet (25 mg total) by mouth daily. 30 tablet 5  . losartan (COZAAR) 100 MG tablet Take 1 tablet (100 mg total) by mouth daily. 30 tablet 5  . metoprolol tartrate (LOPRESSOR) 25 MG tablet Take 1 tablet (25 mg total) by mouth 2 (two) times daily. 30 tablet 5   No  current facility-administered medications for this visit.     Allergies: No Known Allergies  Social History: The patient  reports that he has never smoked. He has never used smokeless tobacco. He reports that he drinks alcohol. He reports that he does not use drugs.   Family History: The patient's family history is not on file. Mother died with heart isses over 40 years when he was a child. Father died of natural causes. 2 brothers have died - one with heart issues. HTN runs in his family. 3 sisters still alive.   Review of Systems: Please see the history of present illness.   Otherwise, the review of systems is positive for none.   All other systems are reviewed and negative.   Physical Exam: VS:  BP 120/86   Pulse 64   Ht 5\' 9"  (1.753 m)   Wt 212 lb 1.9 oz (96.2 kg)   BMI 31.32 kg/m  .  BMI Body mass index is 31.32 kg/m.  Wt Readings from Last 3 Encounters:  06/18/16 212 lb 1.9 oz (96.2 kg)  06/05/16 205 lb (93 kg)  07/26/11 192 lb (87.1 kg)    General: Pleasant. Well developed, well nourished and in no acute distress.   HEENT: Normal.  Neck: Supple, no JVD, carotid bruits, or masses noted.  Cardiac: Regular rate and rhythm. No murmurs, rubs,  or gallops. No edema.  Respiratory:  Lungs are clear to auscultation bilaterally with normal work of breathing.  GI: Soft and nontender.  MS: No deformity or atrophy. Gait and ROM intact.  Skin: Warm and dry. Color is normal.  Neuro:  Strength and sensation are intact and no gross focal deficits noted.  Psych: Alert, appropriate and with normal affect.   LABORATORY DATA:  EKG:  EKG is ordered today. This demonstrates NSR with inferior T wave changes.  Lab Results  Component Value Date   WBC 8.5 06/05/2016   HGB 16.8 06/05/2016   HCT 48.9 06/05/2016   PLT 233 06/05/2016   GLUCOSE 117 (H) 06/04/2016   CHOL 176 06/03/2016   TRIG 134 06/03/2016   HDL 53 06/03/2016   LDLCALC 96 06/03/2016   ALT 41 09/29/2007   AST 31  09/29/2007   NA 137 06/04/2016   K 4.0 06/04/2016   CL 102 06/04/2016   CREATININE 1.08 06/04/2016   BUN 10 06/04/2016   CO2 27 06/04/2016   TSH 3.451 06/04/2016   HGBA1C 5.8 (H) 06/04/2016   Lab Results  Component Value Date   TROPONINI 0.41 ([place]) 06/04/2016    BNP (last 3 results) No results for input(s): BNP in the last 8760 hours.  ProBNP (last 3 results) No results for input(s): PROBNP in the last 8760 hours.   Other Studies Reviewed Today:  Echo Study Conclusions from 05/2016  - Left ventricle: The cavity size was normal. Wall thickness was   increased in a pattern of mild LVH. Systolic function was normal.   The estimated ejection fraction was in the range of 55% to 60%.   Wall motion was normal; there were no regional wall motion   abnormalities. Doppler parameters are consistent with abnormal   left ventricular relaxation (grade 1 diastolic dysfunction). - Aortic valve: There was no stenosis. There was trivial   regurgitation. - Mitral valve: There was trivial regurgitation. - Right ventricle: The cavity size was normal. Systolic function   was normal. - Pulmonary arteries: No complete TR doppler jet so unable to   estimate PA systolic pressure. - Inferior vena cava: The vessel was normal in size. The   respirophasic diameter changes were in the normal range (>= 50%),   consistent with normal central venous pressure.  Impressions:  - Normal LV size with mild LV hypertrophy. EF 55-60%. Normal RV   size and systolic function. No significant valvular   abnormalities.   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 On low dose beta blocker therapy. If recurs would consider EP referral.   2. HTN - BP much better on current regimen. Needs BMET today.   3. + troponin with abnormal EKG - needs Myoview to further evaluate.   Current medicines are reviewed with the patient today.  The patient does not have concerns regarding medicines other than what has  been noted above.  The following changes have been made:  See above.  Labs/ tests ordered today include:    Orders Placed This Encounter  Procedures  . Basic metabolic panel  . Myocardial Perfusion Imaging  . EKG 12-Lead     Disposition:   FU with Dr. Johnsie Cancel tentatively in 6 months unless his study is abnormal.   Patient is agreeable to this plan and will call if any problems develop in the interim.   SignedTruitt Merle, NP  06/18/2016 10:20 AM  [facility] [address] [place], [place]  [address] Phone: [phone] Fax: [phone]

## 2016-06-27 ENCOUNTER — Telehealth (HOSPITAL_COMMUNITY): Payer: Self-pay | Admitting: *Deleted

## 2016-06-27 NOTE — Telephone Encounter (Signed)
Attempted to call patient regarding upcoming appointment- no answer, unable to leave message.  Dakota Holmes Dakota Holmes  

## 2016-06-29 ENCOUNTER — Ambulatory Visit (HOSPITAL_COMMUNITY): Payer: 59 | Attending: Internal Medicine

## 2016-06-29 DIAGNOSIS — R7989 Other specified abnormal findings of blood chemistry: Secondary | ICD-10-CM | POA: Diagnosis not present

## 2016-06-29 DIAGNOSIS — R9439 Abnormal result of other cardiovascular function study: Secondary | ICD-10-CM | POA: Insufficient documentation

## 2016-06-29 DIAGNOSIS — R748 Abnormal levels of other serum enzymes: Secondary | ICD-10-CM | POA: Diagnosis not present

## 2016-06-29 DIAGNOSIS — R9431 Abnormal electrocardiogram [ECG] [EKG]: Secondary | ICD-10-CM | POA: Insufficient documentation

## 2016-06-29 DIAGNOSIS — I10 Essential (primary) hypertension: Secondary | ICD-10-CM | POA: Insufficient documentation

## 2016-06-29 DIAGNOSIS — R778 Other specified abnormalities of plasma proteins: Secondary | ICD-10-CM

## 2016-06-29 LAB — MYOCARDIAL PERFUSION IMAGING
LV dias vol: 120 mL (ref 62–150)
LV sys vol: 55 mL
Peak HR: 126 {beats}/min
RATE: 0.28
Rest HR: 65 {beats}/min
SDS: 4
SRS: 10
SSS: 13
TID: 0.84

## 2016-06-29 MED ORDER — REGADENOSON 0.4 MG/5ML IV SOLN
0.4000 mg | Freq: Once | INTRAVENOUS | Status: AC
  Administered 2016-06-29: 0.4 mg via INTRAVENOUS

## 2016-06-29 MED ORDER — TECHNETIUM TC 99M TETROFOSMIN IV KIT
32.9000 | PACK | Freq: Once | INTRAVENOUS | Status: AC | PRN
  Administered 2016-06-29: 32.9 via INTRAVENOUS
  Filled 2016-06-29: qty 33

## 2016-06-29 MED ORDER — TECHNETIUM TC 99M TETROFOSMIN IV KIT
10.6000 | PACK | Freq: Once | INTRAVENOUS | Status: AC | PRN
  Administered 2016-06-29: 10.6 via INTRAVENOUS
  Filled 2016-06-29: qty 11

## 2016-07-02 NOTE — Discharge Summary (Signed)
Discharge Summary    Patient ID: Dakota Holmes,  MRN: [id number], DOB/AGE: [birth year] 51 y.o.  Admit date: 06/03/2016 Discharge date: 07/02/2016  Primary Care Provider: Thressa Sheller Primary Cardiologist: New Dr. Johnsie Cancel  Discharge Diagnoses    Active Problems:   SVT (supraventricular tachycardia) ([place])   Allergies No Known Allergies  Diagnostic Studies/Procedures    Echo 06/04/2016 Study Conclusions  - Left ventricle: The cavity size was normal. Wall thickness was   increased in a pattern of mild LVH. Systolic function was normal.   The estimated ejection fraction was in the range of 55% to 60%.   Wall motion was normal; there were no regional wall motion   abnormalities. Doppler parameters are consistent with abnormal   left ventricular relaxation (grade 1 diastolic dysfunction). - Aortic valve: There was no stenosis. There was trivial   regurgitation. - Mitral valve: There was trivial regurgitation. - Right ventricle: The cavity size was normal. Systolic function   was normal. - Pulmonary arteries: No complete TR doppler jet so unable to   estimate PA systolic pressure. - Inferior vena cava: The vessel was normal in size. The   respirophasic diameter changes were in the normal range (>= 50%),   consistent with normal central venous pressure.  Impressions:  - Normal LV size with mild LV hypertrophy. EF 55-60%. Normal RV   size and systolic function. No significant valvular   abnormalities. _____________   History of Present Illness     Dakota Holmes is a 51 y.o. male with past medical history of hypertension (untreated since 12/2015) who presented to the [facility] ED for evaluation of shortness of breath and lightheadedness. He was found to be in SVT around 200 bpm.   Hospital Course     Consultants: None  Found to be in SVT with rate around 200 BPM in ED. Likely AVNRT. Duration likely several hours. Converted to SR in the ED with vagal maneuvers.  Metoprolol 25 mg bid initiated. No recurrence on beta blocker. Troponins mildly elevated in pattern of demand ischemia. Echo showed normal LV systolic function and no regional wall motion abnormalities. Blood pressures were elevated. Metoprolol and ARB added, continue home hydrochlorothiazide and discussed medication compliance. Improved BP at discharge. Plan for out patient myoview.   Patient has been seen by Dr. Johnsie Cancel today and deemed ready for discharge home. All follow up appointments have been scheduled.  _____________  Discharge Vitals Blood pressure 131/89, pulse 84, temperature 98.7 F (37.1 C), temperature source Oral, resp. rate 18, height 5\' 9"  (1.753 m), weight 205 lb (93 kg), SpO2 100 %.  Filed Weights   06/04/16 0340 06/04/16 2143 06/05/16 0611  Weight: 209 lb 6.4 oz (95 kg) 207 lb 11.2 oz (94.2 kg) 205 lb (93 kg)    Labs & Radiologic Studies    CBC No results for input(s): WBC, NEUTROABS, HGB, HCT, MCV, PLT in the last 72 hours. Basic Metabolic Panel No results for input(s): NA, K, CL, CO2, GLUCOSE, BUN, CREATININE, CALCIUM, MG, PHOS in the last 72 hours. Liver Function Tests No results for input(s): AST, ALT, ALKPHOS, BILITOT, PROT, ALBUMIN in the last 72 hours. No results for input(s): LIPASE, AMYLASE in the last 72 hours. Cardiac Enzymes No results for input(s): CKTOTAL, CKMB, CKMBINDEX, TROPONINI in the last 72 hours. BNP Invalid input(s): POCBNP D-Dimer No results for input(s): DDIMER in the last 72 hours. Hemoglobin A1C No results for input(s): HGBA1C in the last 72 hours. Fasting Lipid Panel No results for  input(s): CHOL, HDL, LDLCALC, TRIG, CHOLHDL, LDLDIRECT in the last 72 hours. Thyroid Function Tests No results for input(s): TSH, T4TOTAL, T3FREE, THYROIDAB in the last 72 hours.  Invalid input(s): FREET3 _____________  Dg Chest 2 View  Result Date: 06/03/2016 CLINICAL DATA:  Shortness of breath, weakness, headache, tachycardia, history of asthma EXAM:  CHEST  2 VIEW COMPARISON:  07/26/2011 FINDINGS: Lungs are clear.  No pleural effusion or pneumothorax. The heart is normal in size. Mild degenerative changes of the visualized thoracolumbar spine. IMPRESSION: Normal chest radiographs. Electronically Signed   By: Julian Hy M.D.   On: 06/03/2016 16:46   Disposition   Pt is being discharged home today in good condition.  Follow-up Plans & Appointments    Follow-up Information    Truitt Merle, NP Follow up on 06/18/2016.   Specialties:  Nurse Practitioner, Interventional Cardiology, Cardiology, Radiology Why:  at 10:00 for cardiology hospital follow up.  Please arrive 15 minutes early and bring all of your medicaitons with you.  Contact information: [facility] [place] [address] [phone]          Discharge Instructions    Call MD for:  extreme fatigue    Complete by:  As directed    Call MD for:  persistant dizziness or light-headedness    Complete by:  As directed    Diet - low sodium heart healthy    Complete by:  As directed    Increase activity slowly    Complete by:  As directed       Discharge Medications   Discharge Medication List as of 06/05/2016  9:54 AM    START taking these medications   Details  aspirin EC 81 MG EC tablet Take 1 tablet (81 mg total) by mouth daily., Starting Tue 06/05/2016, Normal    losartan (COZAAR) 100 MG tablet Take 1 tablet (100 mg total) by mouth daily., Starting Tue 06/05/2016, Normal    metoprolol tartrate (LOPRESSOR) 25 MG tablet Take 1 tablet (25 mg total) by mouth 2 (two) times daily., Starting Tue 06/05/2016, Normal      CONTINUE these medications which have CHANGED   Details  hydrochlorothiazide (HYDRODIURIL) 25 MG tablet Take 1 tablet (25 mg total) by mouth daily., Starting Tue 06/05/2016, Normal      CONTINUE these medications which have NOT CHANGED   Details  acetaminophen (TYLENOL) 325 MG tablet Take 650 mg by mouth every 6 (six) hours as  needed for mild pain., Historical Med      STOP taking these medications     Aspirin-Acetaminophen-Caffeine (GOODY HEADACHE PO)             Outstanding Labs/Studies   None  Duration of Discharge Encounter   Greater than 30 minutes including physician time.  Signed, Daune Perch NP 07/02/2016, 1:33 PM

## 2016-07-04 ENCOUNTER — Telehealth: Payer: Self-pay | Admitting: Nurse Practitioner

## 2016-07-04 NOTE — Telephone Encounter (Signed)
New message    pt verbalized that he is returning call to speak to rn    For labs

## 2016-07-05 NOTE — Telephone Encounter (Signed)
Follow up      Patient calling back to speak with triage nurse -  Lab results.

## 2016-07-05 NOTE — Telephone Encounter (Signed)
Follow up Call:  Dakota Holmes is calling about Lab results . Please call

## 2016-07-05 NOTE — Telephone Encounter (Signed)
Left message for patient to call back  

## 2016-07-05 NOTE — Telephone Encounter (Signed)
Notes Recorded by Burtis Junes, NP on 06/18/2016 at 4:02 PM EST Ok to report. Labs are stable - would continue on current regimen.  Notified the pt of lab results and recommendations per Truitt Merle NP.  Pt verbalized understanding.

## 2016-07-05 NOTE — Telephone Encounter (Signed)
Left a message for patient to call back. 

## 2016-07-19 DIAGNOSIS — E559 Vitamin D deficiency, unspecified: Secondary | ICD-10-CM | POA: Diagnosis not present

## 2016-07-19 DIAGNOSIS — Z0000 Encounter for general adult medical examination without abnormal findings: Secondary | ICD-10-CM | POA: Diagnosis not present

## 2016-07-26 DIAGNOSIS — I10 Essential (primary) hypertension: Secondary | ICD-10-CM | POA: Diagnosis not present

## 2016-07-26 DIAGNOSIS — Z0000 Encounter for general adult medical examination without abnormal findings: Secondary | ICD-10-CM | POA: Diagnosis not present

## 2017-02-14 DIAGNOSIS — I10 Essential (primary) hypertension: Secondary | ICD-10-CM | POA: Diagnosis not present

## 2017-02-21 DIAGNOSIS — Z23 Encounter for immunization: Secondary | ICD-10-CM | POA: Diagnosis not present

## 2017-02-21 DIAGNOSIS — I10 Essential (primary) hypertension: Secondary | ICD-10-CM | POA: Diagnosis not present

## 2017-06-13 IMAGING — NM NM MISC PROCEDURE
6 series · 36 of 36 positions shown · non-contrast
Comparison: none

[Series 1: wbr_s-proj_st stress-sum-em · 6.40mm/px · 6 of 64 frames shown]
[frame 6/64]
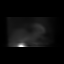
[frame 16/64]
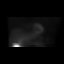
[frame 27/64]
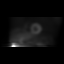
[frame 38/64]
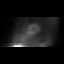
[frame 48/64]
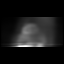
[frame 59/64]
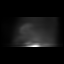

[Series 1: wbr_s-proj_st stress-gsp · 6.40mm/px · 6 of 512 frames shown]
[frame 43/512]
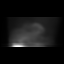
[frame 128/512]
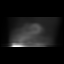
[frame 214/512]
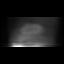
[frame 299/512]
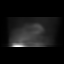
[frame 384/512]
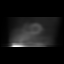
[frame 470/512]
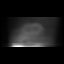

[Series 1: wbr_r-proj_st rest · 6.40mm/px · 6 of 64 frames shown]
[frame 6/64]
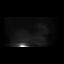
[frame 16/64]
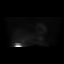
[frame 27/64]
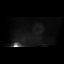
[frame 38/64]
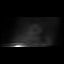
[frame 48/64]
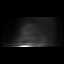
[frame 59/64]
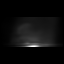

[Series 1: rest · 6.40mm/px · 6 of 64 frames shown]
[frame 6/64]
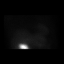
[frame 16/64]
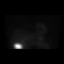
[frame 27/64]
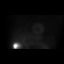
[frame 38/64]
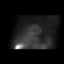
[frame 48/64]
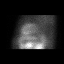
[frame 59/64]
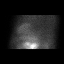

[Series 1: stress-gsp · 6.40mm/px · 6 of 512 frames shown]
[frame 43/512]
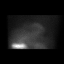
[frame 128/512]
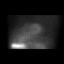
[frame 214/512]
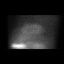
[frame 299/512]
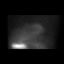
[frame 384/512]
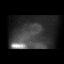
[frame 470/512]
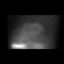

[Series 1: stress-sum-em · 6.40mm/px · 6 of 64 frames shown]
[frame 6/64]
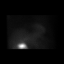
[frame 16/64]
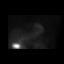
[frame 27/64]
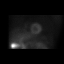
[frame 38/64]
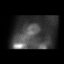
[frame 48/64]
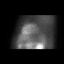
[frame 59/64]
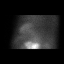

[36 of 36 positions shown; findings below may reference images not displayed]

Canned report from images found in remote index.

Refer to host system for actual result text.

## 2017-08-08 DIAGNOSIS — Z131 Encounter for screening for diabetes mellitus: Secondary | ICD-10-CM | POA: Diagnosis not present

## 2017-08-08 DIAGNOSIS — I10 Essential (primary) hypertension: Secondary | ICD-10-CM | POA: Diagnosis not present

## 2017-08-08 DIAGNOSIS — Z125 Encounter for screening for malignant neoplasm of prostate: Secondary | ICD-10-CM | POA: Diagnosis not present

## 2017-08-15 DIAGNOSIS — I10 Essential (primary) hypertension: Secondary | ICD-10-CM | POA: Diagnosis not present

## 2017-08-15 DIAGNOSIS — J302 Other seasonal allergic rhinitis: Secondary | ICD-10-CM | POA: Diagnosis not present

## 2017-08-15 DIAGNOSIS — Z0000 Encounter for general adult medical examination without abnormal findings: Secondary | ICD-10-CM | POA: Diagnosis not present

## 2017-10-22 DIAGNOSIS — K640 First degree hemorrhoids: Secondary | ICD-10-CM | POA: Diagnosis not present

## 2017-10-22 DIAGNOSIS — Z1211 Encounter for screening for malignant neoplasm of colon: Secondary | ICD-10-CM | POA: Diagnosis not present

## 2017-10-22 DIAGNOSIS — D126 Benign neoplasm of colon, unspecified: Secondary | ICD-10-CM | POA: Diagnosis not present

## 2018-08-11 DIAGNOSIS — Z0000 Encounter for general adult medical examination without abnormal findings: Secondary | ICD-10-CM | POA: Diagnosis not present

## 2018-08-11 DIAGNOSIS — Z125 Encounter for screening for malignant neoplasm of prostate: Secondary | ICD-10-CM | POA: Diagnosis not present

## 2018-08-11 DIAGNOSIS — Z131 Encounter for screening for diabetes mellitus: Secondary | ICD-10-CM | POA: Diagnosis not present

## 2018-08-11 DIAGNOSIS — I10 Essential (primary) hypertension: Secondary | ICD-10-CM | POA: Diagnosis not present

## 2018-08-18 DIAGNOSIS — I10 Essential (primary) hypertension: Secondary | ICD-10-CM | POA: Diagnosis not present

## 2018-08-18 DIAGNOSIS — R7303 Prediabetes: Secondary | ICD-10-CM | POA: Diagnosis not present

## 2018-08-18 DIAGNOSIS — Z23 Encounter for immunization: Secondary | ICD-10-CM | POA: Diagnosis not present

## 2018-08-18 DIAGNOSIS — Z0000 Encounter for general adult medical examination without abnormal findings: Secondary | ICD-10-CM | POA: Diagnosis not present

## 2022-10-23 ENCOUNTER — Encounter (HOSPITAL_BASED_OUTPATIENT_CLINIC_OR_DEPARTMENT_OTHER): Payer: Self-pay

## 2022-10-23 ENCOUNTER — Emergency Department (HOSPITAL_BASED_OUTPATIENT_CLINIC_OR_DEPARTMENT_OTHER)
Admission: EM | Admit: 2022-10-23 | Discharge: 2022-10-23 | Disposition: A | Discharge status: Home / Self Care | Payer: Medicaid Other | Attending: Emergency Medicine | Admitting: Emergency Medicine

## 2022-10-23 ENCOUNTER — Other Ambulatory Visit: Payer: Self-pay

## 2022-10-23 ENCOUNTER — Emergency Department (HOSPITAL_BASED_OUTPATIENT_CLINIC_OR_DEPARTMENT_OTHER): Payer: Medicaid Other

## 2022-10-23 DIAGNOSIS — S0285XB Fracture of orbit, unspecified, initial encounter for open fracture: Secondary | ICD-10-CM

## 2022-10-23 DIAGNOSIS — S0012XA Contusion of left eyelid and periocular area, initial encounter: Secondary | ICD-10-CM | POA: Diagnosis not present

## 2022-10-23 DIAGNOSIS — Z23 Encounter for immunization: Secondary | ICD-10-CM | POA: Insufficient documentation

## 2022-10-23 DIAGNOSIS — S0591XA Unspecified injury of right eye and orbit, initial encounter: Secondary | ICD-10-CM | POA: Diagnosis present

## 2022-10-23 DIAGNOSIS — S0231XB Fracture of orbital floor, right side, initial encounter for open fracture: Secondary | ICD-10-CM | POA: Diagnosis not present

## 2022-10-23 DIAGNOSIS — W19XXXA Unspecified fall, initial encounter: Secondary | ICD-10-CM

## 2022-10-23 DIAGNOSIS — R519 Headache, unspecified: Secondary | ICD-10-CM | POA: Diagnosis not present

## 2022-10-23 DIAGNOSIS — W01198A Fall on same level from slipping, tripping and stumbling with subsequent striking against other object, initial encounter: Secondary | ICD-10-CM | POA: Diagnosis not present

## 2022-10-23 MED ORDER — ERYTHROMYCIN 5 MG/GM OP OINT: TOPICAL_OINTMENT | OPHTHALMIC | 0 refills | Status: AC

## 2022-10-23 MED ORDER — ACETAMINOPHEN 500 MG PO TABS
1000.0000 mg | ORAL_TABLET | Freq: Once | ORAL | Status: AC
  Administered 2022-10-23: 1000 mg via ORAL
  Filled 2022-10-23: qty 2

## 2022-10-23 MED ORDER — ERYTHROMYCIN 5 MG/GM OP OINT
TOPICAL_OINTMENT | Freq: Once | OPHTHALMIC | Status: AC
  Administered 2022-10-23: 1 via OPHTHALMIC
  Filled 2022-10-23: qty 3.5

## 2022-10-23 MED ORDER — TETANUS-DIPHTH-ACELL PERTUSSIS 5-2.5-18.5 LF-MCG/0.5 IM SUSY
0.5000 mL | PREFILLED_SYRINGE | Freq: Once | INTRAMUSCULAR | Status: AC
  Administered 2022-10-23: 0.5 mL via INTRAMUSCULAR
  Filled 2022-10-23: qty 0.5

## 2022-10-23 NOTE — ED Provider Notes (Signed)
[place] [facility]ERGENC[facility] DEPARTMENT AT [facility] Provider Note   CSN: [id number] Arrival date & time: 10/23/22  1710     History Chief Complaint  Patient presents with   Fall     Fall Associated symptoms include headaches. Pertinent negatives include no chest pain.   Dakota Holmes is a 57 y.o. male presenting for  a fall he sustained earlier today while walking the dog. He tripped on the sidewalk and hit his right eye on a low brick wall. He did not have LOC or loss of vision. Vision is intact, but he endorses a HA pain score 3.  Patient's recorded medical, surgical, social, medication list and allergies were reviewed in the Snapshot window as part of the initial history.   Review of Systems   Review of Systems  HENT:  Negative for ear discharge, nosebleeds and rhinorrhea.   Eyes:  Negative for visual disturbance.  Cardiovascular:  Negative for chest pain and palpitations.  Skin:  Positive for wound.  Neurological:  Positive for headaches. Negative for dizziness, seizures, syncope and light-headedness.    Physical Exam Updated Vital Signs BP (!) 158/90   Pulse 89   Temp 98.7 F (37.1 C) (Oral)   Resp 16   Ht 5\' 9"  (1.753 m)   Wt 96.6 kg   SpO2 98%   BMI 31.45 kg/m  Physical Exam Eyes:     Extraocular Movements: Extraocular movements intact.     Conjunctiva/sclera:     Right eye: Hemorrhage present.     Pupils: Pupils are equal, round, and reactive to light.     Comments: Abrasion over the upper and lower eyelid.  Hemorrhagic medial sclera on the right  Significant edema over the right eye.  Visual acuity intact 20/40 R 20/25 L  Neurological:     Mental Status: He is alert and oriented to person, place, and time.     Cranial Nerves: Cranial nerves 2-12 are intact.     Sensory: Sensation is intact.     Motor: Motor function is intact.          Cleaned wound with wet gauze and applied steri strips.  ED Course/ Medical Decision Making/  A&P Clinical Course as of 10/23/22 2258  Tue Oct 23, 2022  2042 Stable 10 YOM with a ccx of fall. Fell and hit eye and chemosis of the eye.  Vision grossly intact: No AC  AP Visual acuity, eyelid lac repair possibly, tonopen.   [CC]  2205 [facility]. [CC]  2229 OPH said OP in AM and ENT referral  Erythromycin ointment tonight  [CC]  2229 1. OPH in AM 2. ENT in 1 week 3. Erythromycin BID 4. Do not blow your nose.  [CC]    Clinical Course User Index [CC] Glyn Ade, MD    Procedures Procedures   Medications Ordered in ED Medications  acetaminophen (TYLENOL) tablet 1,000 mg (1,000 mg Oral Given 10/23/22 2100)  Tdap (BOOSTRIX) injection 0.5 mL (0.5 mLs Intramuscular Given 10/23/22 2059)  erythromycin ophthalmic ointment (1 Application Right Eye Given 10/23/22 2240)    Medical Decision Making:    Dakota Holmes is a 57 y.o. male who presented to the ED today after a fall and hitting his eye over a brick wall. He did not loose conciousness and is not on any anticoagulants. He does have a headache pain severity score 3.       Complete initial physical exam performed, notably the patient  was for a right edematous eye  with no vision loss but severe ecchymoses in the sclera along with a lacerations around the eyelids.     Reviewed and confirmed nursing documentation for past medical history, family history, social history.    Initial Assessment:   With the patient's presentation of trauam to the left eye and head after tripping over the sidewalk and hitting his head on a brick wall.   Initial Plan:   CT scan of the head given age and possibility of subdural hematoma  CT scan of orbits due to hemorrhagic sclera and chemosis.  Pain management.  Initial Study Results:     Radiology  All images reviewed independently. Agree with radiology report at this time.   CT Head Wo Contrast  Result Date: 10/23/2022 CLINICAL DATA:  Trauma skin tear to upper eyelid right size EXAM:  CT HEAD AND ORBITS WITHOUT CONTRAST TECHNIQUE: Contiguous axial images were obtained from the base of the skull through the vertex without contrast. Multidetector CT imaging of the orbits was performed using the standard protocol without intravenous contrast. RADIATION DOSE REDUCTION: This exam was performed according to the departmental dose-optimization program which includes automated exposure control, adjustment of the mA and/or kV according to patient size and/or use of iterative reconstruction technique. COMPARISON:  None Available. FINDINGS: CT HEAD FINDINGS Brain: No acute territorial infarction, hemorrhage or intracranial mass. Nonenlarged ventricles Vascular: No hyperdense vessels.  Carotid vascular calcification Skull: No depressed skull fracture Other: None CT ORBITS FINDINGS Orbits: Left orbit is intact. Acute comminuted blowout fracture medial wall right orbit with herniation of moderate intra orbital fat into the adjacent ethmoid sinus. Medial rectus muscle maintains its position within the orbit however there is soft tissue stranding about the muscle belly likely due to contusion. Additional minimally displaced fracture involving the medial floor of right orbit. Multiple gas locules within the orbit. Post septal high density stranding within the ocular fat medial aspect right orbit consistent with contusion and probable small hemorrhage. Globe appears intact. The lens appears normally position. Visualized sinuses: Hyperdense blood in the right ethmoid sinus. Mild mucosal thickening in right maxillary sinus without sinus wall fracture. Sphenoid sinuses are clear. Mild mucosal thickening in the frontal ethmoidal sinuses without fluid level or fracture Soft tissues: Considerable right periorbital hematoma IMPRESSION: 1. No CT evidence for acute intracranial abnormality. 2. Acute comminuted blowout fracture medial wall right orbit with herniation of moderate intra orbital fat into the adjacent ethmoid  sinus. Additional minimally displaced fracture involving the medial floor of right orbit. Post septal high density stranding within the ocular fat medial aspect right orbit consistent with contusion and probable small hemorrhage. Medial rectus muscle maintains its position within the orbit however there is slight thickening of the muscle belly and associated soft tissue stranding about the muscle belly likely due to contusion. 3. Considerable right periorbital hematoma. Electronically Signed   By: Jasmine Pang M.D.   On: 10/23/2022 21:57   CT Orbits Wo Contrast  Result Date: 10/23/2022 CLINICAL DATA:  Trauma skin tear to upper eyelid right size EXAM: CT HEAD AND ORBITS WITHOUT CONTRAST TECHNIQUE: Contiguous axial images were obtained from the base of the skull through the vertex without contrast. Multidetector CT imaging of the orbits was performed using the standard protocol without intravenous contrast. RADIATION DOSE REDUCTION: This exam was performed according to the departmental dose-optimization program which includes automated exposure control, adjustment of the mA and/or kV according to patient size and/or use of iterative reconstruction technique. COMPARISON:  None Available. FINDINGS: CT HEAD  FINDINGS Brain: No acute territorial infarction, hemorrhage or intracranial mass. Nonenlarged ventricles Vascular: No hyperdense vessels.  Carotid vascular calcification Skull: No depressed skull fracture Other: None CT ORBITS FINDINGS Orbits: Left orbit is intact. Acute comminuted blowout fracture medial wall right orbit with herniation of moderate intra orbital fat into the adjacent ethmoid sinus. Medial rectus muscle maintains its position within the orbit however there is soft tissue stranding about the muscle belly likely due to contusion. Additional minimally displaced fracture involving the medial floor of right orbit. Multiple gas locules within the orbit. Post septal high density stranding within the  ocular fat medial aspect right orbit consistent with contusion and probable small hemorrhage. Globe appears intact. The lens appears normally position. Visualized sinuses: Hyperdense blood in the right ethmoid sinus. Mild mucosal thickening in right maxillary sinus without sinus wall fracture. Sphenoid sinuses are clear. Mild mucosal thickening in the frontal ethmoidal sinuses without fluid level or fracture Soft tissues: Considerable right periorbital hematoma IMPRESSION: 1. No CT evidence for acute intracranial abnormality. 2. Acute comminuted blowout fracture medial wall right orbit with herniation of moderate intra orbital fat into the adjacent ethmoid sinus. Additional minimally displaced fracture involving the medial floor of right orbit. Post septal high density stranding within the ocular fat medial aspect right orbit consistent with contusion and probable small hemorrhage. Medial rectus muscle maintains its position within the orbit however there is slight thickening of the muscle belly and associated soft tissue stranding about the muscle belly likely due to contusion. 3. Considerable right periorbital hematoma. Electronically Signed   By: Jasmine Pang M.D.   On: 10/23/2022 21:57     Consults:  Case discussed with Opthalmology.   Final Assessment and Plan:    #Acute comminuted blowout fracture of the medial right orbit #Contusion of the Left Eye  #Orbit Fracture  -Ophthalmology will see him tomorrow AM  -ENT referral placed due to the orbit fracture -Erythromycin ointment on discharge.   Clinical Impression:  1. Open fracture of left orbit, initial encounter (HCC)   2. Fall, initial encounter      Data Unavailable   Final Clinical Impression(s) / ED Diagnoses Final diagnoses:  Open fracture of left orbit, initial encounter [facility])  Fall, initial encounter    Rx / DC Orders ED Discharge Orders          Ordered    erythromycin ophthalmic ointment        10/23/22 2230               [place], [place], MD 10/23/22 2259    Glyn Ade, MD 10/24/22 1521

## 2022-10-23 NOTE — Discharge Instructions (Addendum)
1. OPH in AM 2. ENT in 1 week 3. Erythromycin BID 4. Do not blow your nose.

## 2022-10-23 NOTE — ED Triage Notes (Signed)
Patient here POV from Home.  Endorses Fall today, an hour ago. Tripped while walking dog and hit Right Eye on [address]. No LOC or Anticoagulants. Bruising to Right Eye with Swelling. Skin Tear to Upper Eye lid and Laceration to bottom.  No C-Spine Tenderness. Mild Headache. No Back Pain. Unsure of Tetanus.  NAD Noted during Triage. A&Ox4. GCS 15. Ambulatory.
